# Patient Record
Sex: Male | Born: 2005 | Race: White | Hispanic: No | Marital: Single | Smoking: Never smoker
Health system: Southern US, Community
[De-identification: ages and names within clinical notes are randomized; demographics above are authoritative.]

---
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
## 2024-03-11 DIAGNOSIS — R519 Headache, unspecified: Secondary | ICD-10-CM | POA: Diagnosis not present

## 2024-03-11 DIAGNOSIS — R0981 Nasal congestion: Secondary | ICD-10-CM | POA: Diagnosis not present

## 2024-03-11 DIAGNOSIS — R509 Fever, unspecified: Secondary | ICD-10-CM | POA: Diagnosis not present

## 2024-03-11 DIAGNOSIS — R051 Acute cough: Secondary | ICD-10-CM | POA: Diagnosis not present

## 2024-04-30 DIAGNOSIS — R051 Acute cough: Secondary | ICD-10-CM | POA: Diagnosis not present

## 2024-04-30 DIAGNOSIS — R509 Fever, unspecified: Secondary | ICD-10-CM | POA: Diagnosis not present

## 2024-04-30 DIAGNOSIS — R070 Pain in throat: Secondary | ICD-10-CM | POA: Diagnosis not present

## 2024-04-30 DIAGNOSIS — R0981 Nasal congestion: Secondary | ICD-10-CM | POA: Diagnosis not present
